# Patient Record
Sex: Male | Born: 1965 | Race: White | Hispanic: No
Health system: Southern US, Community
[De-identification: ages and names within clinical notes are randomized; demographics above are authoritative.]

## PROBLEM LIST (undated history)

## (undated) ENCOUNTER — Emergency Department (HOSPITAL_COMMUNITY): Admission: EM | Payer: PRIVATE HEALTH INSURANCE | Source: Home / Self Care

## (undated) ENCOUNTER — Emergency Department (HOSPITAL_COMMUNITY): Payer: PRIVATE HEALTH INSURANCE

---

## 2004-04-23 ENCOUNTER — Encounter (INDEPENDENT_AMBULATORY_CARE_PROVIDER_SITE_OTHER): Payer: Self-pay | Admitting: *Deleted

## 2004-04-23 ENCOUNTER — Ambulatory Visit (HOSPITAL_COMMUNITY): Admission: RE | Admit: 2004-04-23 | Discharge: 2004-04-23 | Payer: Self-pay | Admitting: Gastroenterology

## 2005-07-15 ENCOUNTER — Ambulatory Visit: Payer: Self-pay | Admitting: Internal Medicine

## 2005-10-08 ENCOUNTER — Emergency Department (HOSPITAL_COMMUNITY): Admission: EM | Admit: 2005-10-08 | Discharge: 2005-10-08 | Payer: Self-pay | Admitting: Emergency Medicine

## 2005-10-12 ENCOUNTER — Ambulatory Visit (HOSPITAL_COMMUNITY): Admission: RE | Admit: 2005-10-12 | Discharge: 2005-10-13 | Payer: Self-pay | Admitting: Orthopaedic Surgery

## 2005-10-24 ENCOUNTER — Encounter: Admission: RE | Admit: 2005-10-24 | Discharge: 2005-10-24 | Payer: Self-pay | Admitting: Orthopaedic Surgery

## 2006-02-02 ENCOUNTER — Encounter: Admission: RE | Admit: 2006-02-02 | Discharge: 2006-02-02 | Payer: Self-pay | Admitting: Orthopaedic Surgery

## 2006-03-19 ENCOUNTER — Emergency Department: Payer: Self-pay | Admitting: Emergency Medicine

## 2006-10-30 ENCOUNTER — Emergency Department: Payer: Self-pay | Admitting: Unknown Physician Specialty

## 2006-11-03 ENCOUNTER — Ambulatory Visit: Payer: Self-pay | Admitting: Internal Medicine

## 2006-12-30 ENCOUNTER — Encounter (INDEPENDENT_AMBULATORY_CARE_PROVIDER_SITE_OTHER): Payer: Self-pay | Admitting: Orthopaedic Surgery

## 2006-12-30 ENCOUNTER — Ambulatory Visit (HOSPITAL_COMMUNITY): Admission: RE | Admit: 2006-12-30 | Discharge: 2006-12-30 | Payer: Self-pay | Admitting: Orthopaedic Surgery

## 2007-01-10 ENCOUNTER — Inpatient Hospital Stay (HOSPITAL_COMMUNITY): Admission: AD | Admit: 2007-01-10 | Discharge: 2007-01-13 | Payer: Self-pay | Admitting: Orthopaedic Surgery

## 2007-07-03 ENCOUNTER — Ambulatory Visit: Payer: Self-pay | Admitting: Urology
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
## 2008-07-02 ENCOUNTER — Ambulatory Visit: Payer: Self-pay | Admitting: Urology

## 2010-06-30 ENCOUNTER — Ambulatory Visit: Payer: Self-pay | Admitting: Urology
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
## 2010-07-28 NOTE — Op Note (Signed)
Alex Doyle, MENDE            ACCOUNT NO.:  [id number]   MEDICAL RECORD NO.:  [id number]          PATIENT TYPE:  OIB   LOCATION:  5035                         FACILITY:  [facility]   PHYSICIAN:  Vanita Panda. Magnus Ivan, M.D.DATE OF BIRTH:  [birth year]   DATE OF PROCEDURE:  01/10/2007  DATE OF DISCHARGE:                               OPERATIVE REPORT   PREOPERATIVE DIAGNOSIS:  Left wrist postoperative infection.   POSTOPERATIVE DIAGNOSIS:  Left wrist deep postoperative infection.   PROCEDURE:  Thorough irrigation debridement of left wrist including  removal of allograft bone graft.   SURGEON:  Doneen Poisson, MD   ANESTHESIA:  General.   ANTIBIOTICS:  1 gram IV vancomycin.   BLOOD LOSS:  75 mL.   COMPLICATIONS:  None.   INDICATIONS:  Brief Mr. Vonderhaar is a 45 year old who two weeks ago  underwent a removable plate from his left wrist that had been in since  July of 2007.  There was a lucency around the plate and he had continued  wrist pain.  There was concerned about the possibility of infection.  At  the time of surgery, intraoperative cultures were negative.  He did have  inflammatory cells present when we removed the plate.  There was a  deficit in the bone and so I elected to leave hardware out but put in  allograft bone graft.  He subsequently presented today to the office  with worsening redness in his arm and pain.  There was drainage from the  wrist as well.  I elected to proceed urgently with irrigation and  debridement.  Risks and benefits of this were explained and well  understood.  He agreed to proceed with surgery.   PROCEDURE DESCRIPTION:  After informed consent and appropriate left arm  was marked, he was brought to the operating room and placed supine on  the operating table.  General anesthesia was then obtained.  His arm was  prepped and draped with Betadine and scrubbed.  Time-out was called and  he was identified as to correct patient and correct  extremity.  I then  removed his previous sutures and gross purulence was encountered all the  way down to the bone graft site.  I used curettes removed all bone graft  and debris.  I then thoroughly irrigated tissues with 3 L of normal  saline solution using pulsatile lavage followed by 500 mL of bacitracin  solution.  A medium Hemovac was  then placed in the wound and I closed the skin with interrupted 2-0  nylon suture.  Xeroform followed well-padded sterile dressing were  applied.  The patient was awakened, extubated, taken to recovery room in  stable condition.  Postoperatively I will keep him on IV antibiotics and  will transition him to then oral antibiotics as appropriate.      Vanita Panda. Magnus Ivan, M.D.  Electronically Signed     CYB/MEDQ  D:  01/10/2007  T:  01/11/2007  Job:  [id number]

## 2010-07-28 NOTE — Discharge Summary (Signed)
NAMECHIOKE, Alex Doyle            ACCOUNT NO.:  [id number]   MEDICAL RECORD NO.:  [id number]          PATIENT TYPE:  INP   LOCATION:  5035                         FACILITY:  [facility]   PHYSICIAN:  Vanita Panda. Magnus Ivan, M.D.DATE OF BIRTH:  11/18/1965   DATE OF ADMISSION:  01/10/2007  DATE OF DISCHARGE:  01/13/2007                               DISCHARGE SUMMARY   ADMITTING DIAGNOSIS:  Deep postop infection and abscess, left wrist.   DISCHARGE DIAGNOSIS:  Deep postop infection, left wrist.   PROCEDURES:  Open irrigation and debridement of left wrist.   HOSPITAL COURSE:  Mr. Heldman is a 45 year old who presented to the  office with postoperative infection following removal of previous  hardware.  Original cultures did show a sensitive Staph aureus  infection.  He came to the office with overwhelming drainage and he was  taken to the operating room on the day he presented to the office and on  the day of admission.  Thorough irrigation and debridement was carried  out and he was admitted for IV antibiotics.  I followed his white count  to make sure that it was trending down and by the day of discharge he  had been afebrile.  His white blood cell count was down from 13.9 to  11.0.  His incision was clean, dry and intact.  There was no pain and no  erythema.   DISPOSITION:  To home.   DISCHARGE MEDICATIONS:  1. Vicodin p.r.n.  2. Stool softener p.r.n.  3. Clindamycin 16 mg t.i.d.   DISCHARGE INSTRUCTIONS:  While at home he is to keep his incision clean  and dry and can start showering by Monday.  A followup appointment  should be established for next Wednesday in the office and he will stay  on oral antibiotics.      Vanita Panda. Magnus Ivan, M.D.  Electronically Signed     CYB/MEDQ  D:  01/13/2007  T:  01/13/2007  Job:  [id number]
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
## 2010-07-31 NOTE — Op Note (Signed)
[name], [name]            ACCOUNT NO.:  [id number]   MEDICAL RECORD NO.:  [id number]          PATIENT TYPE:  AMB   LOCATION:  ENDO                         FACILITY:  [facility]   PHYSICIAN:  Danise Edge, M.D.   DATE OF BIRTH:  07/04/1965   DATE OF PROCEDURE:  04/23/2004  DATE OF DISCHARGE:                                 OPERATIVE REPORT   PROCEDURE:  Colonoscopy and polypectomy/   INDICATIONS:  Mr. Alex Doyle is a 45 year old male, born September 05, 1965.  Mr. Alex Doyle is undergoing diagnostic colonoscopy to evaluate guaiac-  positive stool associated with a normal hemoglobin.   ENDOSCOPIST:  Reece Agar, MD   PREMEDICATION:  1.  Versed 10 mg.  2.  Demerol 100 mg.   PROCEDURE:  After obtaining informed consent, Mr. Alex Doyle was placed in the  left lateral decubitus position.  I administered intravenous Demerol and  intravenous Versed to achieve conscious sedation for the procedure.  The  patient's blood pressure, oxygen saturation, and cardiac rhythm were  monitored throughout the procedure and documented in the medical record.   Anal inspection revealed a small, thrombosed, external hemorrhoid.  I did  not examine the prostate.  The Olympus adjustable pediatric colonoscope was  introduced into the rectum and advanced to the cecum.  A normal-appearing  ileocecal valve was intubated and the distal ileum inspected.  Colonic  preparation for the exam today was excellent.   Rectum normal.  Sigmoid colon and descending colon normal.  Splenic flexure normal.  Transverse colon normal.  Hepatic flexure.  A 1-mm sessile polyp was removed from the hepatic flexure  with cold biopsy forceps.  Ascending colon normal.  Cecum and ileocecal valve normal.  Distal ileum normal.   ASSESSMENT:  A small polyp was removed from the hepatic flexure; otherwise  normal diagnostic proctocolonoscopy to the cecum with inspection of the  distal ileum.  A small, thrombosed external  hemorrhoid present.      MJ/MEDQ  D:  04/23/2004  T:  04/23/2004  Job:  [id number]   cc:   Ike Bene, M.D.  [address]  [place]  [place] [address]  Fax: [phone]

## 2010-12-23 LAB — BASIC METABOLIC PANEL
BUN: 21
Calcium: 9.3
GFR calc non Af Amer: >60
Glucose, Bld: 100 — ABNORMAL HIGH

## 2010-12-23 LAB — DIFFERENTIAL
Basophils Absolute: 0.1
Basophils Relative: 0
Monocytes Absolute: 1.1 — ABNORMAL HIGH
Neutro Abs: 8.8 — ABNORMAL HIGH
Neutrophils Relative %: 71

## 2010-12-23 LAB — CBC
HCT: 40.2
Hemoglobin: 13.2
Hemoglobin: 13.5
Hemoglobin: 13.8
Hemoglobin: 13.9
MCHC: 33.7
MCHC: 33.8
MCHC: 34
MCHC: 34.4
MCV: 89.8
MCV: 91.9
Platelets: 269
Platelets: 403 — ABNORMAL HIGH
RBC: 4.28
RBC: 4.48
RDW: 12.3
RDW: 12.5
RDW: 12.8
RDW: 12.9

## 2010-12-23 LAB — WOUND CULTURE

## 2010-12-23 LAB — ANAEROBIC CULTURE

## 2010-12-23 LAB — GRAM STAIN

## 2013-02-16 ENCOUNTER — Ambulatory Visit: Payer: Self-pay | Admitting: Gastroenterology

## 2014-02-21 ENCOUNTER — Other Ambulatory Visit: Payer: Self-pay | Admitting: Dermatology

## 2019-01-23 ENCOUNTER — Telehealth: Payer: Self-pay | Admitting: *Deleted

## 2019-01-23 NOTE — Telephone Encounter (Signed)
Received referral for low dose lung cancer screening CT scan. Message left at phone number listed in EMR for patient to call me back to facilitate scheduling scan.  

## 2019-01-25 ENCOUNTER — Telehealth: Payer: Self-pay | Admitting: *Deleted

## 2019-01-25 NOTE — Telephone Encounter (Signed)
Received referral for low dose lung cancer screening CT scan. Message left at phone number listed in EMR for patient to call me back to facilitate scheduling scan.  

## 2019-01-31 ENCOUNTER — Telehealth: Payer: Self-pay | Admitting: *Deleted

## 2019-01-31 NOTE — Telephone Encounter (Signed)
Received referral for low dose lung cancer screening CT scan. Message left at phone number listed in EMR for patient to call me back to facilitate scheduling scan.  

## 2019-02-05 ENCOUNTER — Encounter: Payer: Self-pay | Admitting: *Deleted

## 2020-10-26 ENCOUNTER — Other Ambulatory Visit: Payer: Self-pay

## 2020-10-26 ENCOUNTER — Emergency Department: Payer: BC Managed Care – PPO

## 2020-10-26 ENCOUNTER — Emergency Department
Admission: EM | Admit: 2020-10-26 | Discharge: 2020-10-26 | Disposition: A | Discharge status: Home / Self Care | Payer: BC Managed Care – PPO | Attending: Emergency Medicine | Admitting: Emergency Medicine

## 2020-10-26 DIAGNOSIS — S8002XA Contusion of left knee, initial encounter: Secondary | ICD-10-CM | POA: Insufficient documentation

## 2020-10-26 DIAGNOSIS — Y92410 Unspecified street and highway as the place of occurrence of the external cause: Secondary | ICD-10-CM | POA: Insufficient documentation

## 2020-10-26 DIAGNOSIS — S5001XA Contusion of right elbow, initial encounter: Secondary | ICD-10-CM | POA: Diagnosis not present

## 2020-10-26 DIAGNOSIS — S060X0A Concussion without loss of consciousness, initial encounter: Secondary | ICD-10-CM | POA: Diagnosis not present

## 2020-10-26 DIAGNOSIS — S0990XA Unspecified injury of head, initial encounter: Secondary | ICD-10-CM | POA: Diagnosis present

## 2020-10-26 MED ORDER — EXCEDRIN MIGRAINE 250-250-65 MG PO TABS: 1.0000 | ORAL_TABLET | Freq: Four times a day (QID) | ORAL | 0 refills | Status: AC | PRN

## 2020-10-26 MED ORDER — ONDANSETRON 4 MG PO TBDP: 4.0000 mg | ORAL_TABLET | Freq: Three times a day (TID) | ORAL | 1 refills | Status: AC | PRN
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
## 2020-10-26 NOTE — ED Notes (Signed)
Secure chat message sent to dr. Scotty Court regarding possible need for head ct. Awaiting md response.
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
## 2021-09-11 ENCOUNTER — Other Ambulatory Visit: Payer: Self-pay | Admitting: Physical Medicine and Rehabilitation

## 2021-09-11 DIAGNOSIS — M5416 Radiculopathy, lumbar region: Secondary | ICD-10-CM

## 2021-10-09 ENCOUNTER — Inpatient Hospital Stay: Admission: RE | Admit: 2021-10-09 | Payer: PRIVATE HEALTH INSURANCE | Source: Ambulatory Visit

## 2021-10-23 ENCOUNTER — Ambulatory Visit
Admission: RE | Admit: 2021-10-23 | Discharge: 2021-10-23 | Disposition: A | Discharge status: Home / Self Care | Payer: PRIVATE HEALTH INSURANCE | Source: Ambulatory Visit | Attending: Physical Medicine and Rehabilitation | Admitting: Physical Medicine and Rehabilitation

## 2021-10-23 DIAGNOSIS — M5416 Radiculopathy, lumbar region: Secondary | ICD-10-CM

## 2023-11-24 ENCOUNTER — Other Ambulatory Visit: Payer: Self-pay | Admitting: Internal Medicine

## 2023-11-24 DIAGNOSIS — F17210 Nicotine dependence, cigarettes, uncomplicated: Secondary | ICD-10-CM

## 2023-11-24 DIAGNOSIS — Z0000 Encounter for general adult medical examination without abnormal findings: Secondary | ICD-10-CM
